# Patient Record
Sex: Female | Born: 1990 | Hispanic: Yes | Marital: Single | Smoking: Never smoker
Health system: Southern US, Community
[De-identification: ages and names within clinical notes are randomized; demographics above are authoritative.]

## PROBLEM LIST (undated history)

## (undated) DIAGNOSIS — M62830 Muscle spasm of back: Secondary | ICD-10-CM

## (undated) DIAGNOSIS — F329 Major depressive disorder, single episode, unspecified: Secondary | ICD-10-CM

## (undated) DIAGNOSIS — F32A Depression, unspecified: Secondary | ICD-10-CM

---
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
## 2017-09-15 ENCOUNTER — Emergency Department (HOSPITAL_COMMUNITY): Payer: Self-pay

## 2017-09-15 ENCOUNTER — Encounter (HOSPITAL_COMMUNITY): Payer: Self-pay

## 2017-09-15 ENCOUNTER — Emergency Department (HOSPITAL_COMMUNITY)
Admission: EM | Admit: 2017-09-15 | Discharge: 2017-09-15 | Disposition: A | Discharge status: Home / Self Care | Payer: Self-pay | Attending: Emergency Medicine | Admitting: Emergency Medicine

## 2017-09-15 ENCOUNTER — Other Ambulatory Visit: Payer: Self-pay

## 2017-09-15 DIAGNOSIS — W260XXA Contact with knife, initial encounter: Secondary | ICD-10-CM | POA: Insufficient documentation

## 2017-09-15 DIAGNOSIS — Y939 Activity, unspecified: Secondary | ICD-10-CM | POA: Insufficient documentation

## 2017-09-15 DIAGNOSIS — Y92000 Kitchen of unspecified non-institutional (private) residence as  the place of occurrence of the external cause: Secondary | ICD-10-CM | POA: Insufficient documentation

## 2017-09-15 DIAGNOSIS — S61012A Laceration without foreign body of left thumb without damage to nail, initial encounter: Secondary | ICD-10-CM | POA: Insufficient documentation

## 2017-09-15 DIAGNOSIS — Y999 Unspecified external cause status: Secondary | ICD-10-CM | POA: Insufficient documentation

## 2017-09-15 DIAGNOSIS — Z23 Encounter for immunization: Secondary | ICD-10-CM | POA: Insufficient documentation

## 2017-09-15 HISTORY — DX: Major depressive disorder, single episode, unspecified: F32.9

## 2017-09-15 HISTORY — DX: Muscle spasm of back: M62.830

## 2017-09-15 HISTORY — DX: Depression, unspecified: F32.A

## 2017-09-15 MED ORDER — BACITRACIN ZINC 500 UNIT/GM EX OINT: 1.0000 "application " | TOPICAL_OINTMENT | Freq: Two times a day (BID) | CUTANEOUS | 0 refills | Status: AC

## 2017-09-15 MED ORDER — TETANUS-DIPHTH-ACELL PERTUSSIS 5-2.5-18.5 LF-MCG/0.5 IM SUSP
0.5000 mL | Freq: Once | INTRAMUSCULAR | Status: AC
  Administered 2017-09-15: 0.5 mL via INTRAMUSCULAR
  Filled 2017-09-15: qty 0.5

## 2017-09-15 MED ORDER — LIDOCAINE-EPINEPHRINE (PF) 2 %-1:200000 IJ SOLN
10.0000 mL | Freq: Once | INTRAMUSCULAR | Status: AC
  Administered 2017-09-15: 10 mL
  Filled 2017-09-15: qty 20

## 2017-09-15 NOTE — ED Provider Notes (Signed)
[facility]-EMERGENCY DEPT Provider Note   CSN: [id number] Arrival date & time: 09/15/17  1016     History   Chief Complaint Chief Complaint  Patient presents with  . thumb laceration    HPI NEKESHA FONT is a 27 y.o. female.  HPI  27 year old female comes in with chief complaint of laceration.  Patient states that she was helping her friend in the kitchen when she cut herself with a chef knife.  Patient had significant bleeding, and decided to come to the ER for further evaluation.  Patient is unsure about her tetanus status.  She denies any numbness, tingling.  Past Medical History:  Diagnosis Date  . Back spasm    chronic  . Depression     There are no active problems to display for this patient.   History reviewed. No pertinent surgical history.   OB History   None      Home Medications    Prior to Admission medications   Medication Sig Start Date End Date Taking? Authorizing Provider  citalopram (CELEXA) 10 MG tablet Take 10 mg by mouth daily. 05/10/17  Yes [provider]  tiZANidine (ZANAFLEX) 4 MG tablet Take 4 mg by mouth 3 (three) times daily. 08/13/17  Yes [provider]  traZODone (DESYREL) 50 MG tablet Take 50 mg by mouth at bedtime. 06/28/17  Yes [provider]    Family History History reviewed. No pertinent family history.  Social History Social History   Tobacco Use  . Smoking status: Never Smoker  . Smokeless tobacco: Never Used  Substance Use Topics  . Alcohol use: Yes    Comment: rarely  . Drug use: Never     Allergies   Patient has no known allergies.   Review of Systems Review of Systems  Constitutional: Positive for activity change.  Skin: Positive for wound.  Allergic/Immunologic: Negative for immunocompromised state.  Neurological: Negative for numbness.  Hematological: Does not bruise/bleed easily.     Physical Exam Updated Vital Signs BP 102/63 (BP Location:  Right Arm)   Pulse 78   Temp 98.3 F (36.8 C) (Oral)   Resp 18   Ht 5' (1.524 m)   Wt 57.6 kg (127 lb)   LMP 09/08/2017   SpO2 100%   BMI 24.80 kg/m   Physical Exam  Constitutional: She appears well-developed.  HENT:  Head: Normocephalic and atraumatic.  Eyes: EOM are normal.  Neck: Normal range of motion. Neck supple.  Cardiovascular: Normal rate.  Pulmonary/Chest: Effort normal.  Neurological: She is alert.  Skin:  On the dorsal aspect of the left thumb there is a 1 cm laceration below the thumb. No active bleeding. Range of motion of the thumb, including flexion, opposition abduction and abduction is normal.  Nursing note and vitals reviewed.    ED Treatments / Results  Labs (all labs ordered are listed, but only abnormal results are displayed) Labs Reviewed - No data to display  EKG None  Radiology Dg Finger Thumb Left  Result Date: 09/15/2017 CLINICAL DATA:  Recent laceration of the thumb with a knife, initial encounter EXAM: LEFT THUMB 2+V COMPARISON:  None. FINDINGS: There is no evidence of fracture or dislocation. There is no evidence of arthropathy or other focal bone abnormality. Soft tissues are unremarkable IMPRESSION: No acute abnormality noted. Electronically Signed   By: Alcide Clever M.D.   On: 09/15/2017 12:23    Procedures Procedures (including critical care time)  Medications Ordered in ED Medications  lidocaine-EPINEPHrine (XYLOCAINE W/EPI) 2 %-1:200000 (PF) injection 10 mL (10 mLs Infiltration Given by Other 09/15/17 1235)  Tdap (BOOSTRIX) injection 0.5 mL (0.5 mLs Intramuscular Given 09/15/17 1234)     Initial Impression / Assessment and Plan / ED Course  I have reviewed the triage vital signs and the nursing notes.  Pertinent labs & imaging results that were available during my care of the patient were reviewed by me and considered in my medical decision making (see chart for details).     27 year old healthy woman comes in after she cut  herself with a knife. The wound appears to be dry based on patient's description.  APP to numb the thumb and repair the laceration, and clean the wound.  Tdap will be given here.  Final Clinical Impressions(s) / ED Diagnoses   Final diagnoses:  Thumb laceration, left, initial encounter    ED Discharge Orders    None       Derwood KaplanNanavati, Creighton Longley, MD 09/15/17 1243

## 2017-09-15 NOTE — ED Triage Notes (Signed)
patient cut her left thumb below the nail bed with a knife while cutting sausage.Bleeding controlled.

## 2017-09-15 NOTE — Discharge Instructions (Addendum)
We saw you in the ER for your laceration. Please read the instructions provided on wound care. Keep the area clean and dry, apply bacitracin ointment daily and take the medications provided. RETURN TO THE ER IF THERE IS INCREASED PAIN, REDNESS, PUS COMING OUT from the wound site.

## 2017-09-15 NOTE — ED Provider Notes (Signed)
Suture repair of L thumb per Dr. Brandy HaleNanavati's request.   LACERATION REPAIR Performed by: Fayrene HelperBowie Oluwatomiwa Chang Authorized by: Fayrene HelperBowie Jamilia Jacques Consent: Verbal consent obtained. Risks and benefits: risks, benefits and alternatives were discussed Consent given by: patient Patient identity confirmed: provided demographic data Prepped and Draped in normal sterile fashion Wound explored  Laceration Location: L thumb, distal phalanx dorsally without nail involvement  Laceration Length: 1cm  No Foreign Bodies seen or palpated  Anesthesia: digital nerve block   Local anesthetic: lidocaine 2% w epinephrine  Anesthetic total: 3 ml  Irrigation method: syringe Amount of cleaning: standard  Skin closure: prolene 5.0  Number of sutures: 3  Technique: simple interrupted  Patient tolerance: Patient tolerated the procedure well with no immediate complications.    Fayrene Helperran, Katilin Raynes, PA-C 09/15/17 1257    Derwood KaplanNanavati, Ankit, MD 09/21/17 [id number]

## 2019-07-27 IMAGING — CR DG FINGER THUMB 2+V*L*
3 series · 3 of 3 positions shown · non-contrast
Comparison: None.

CLINICAL DATA: Recent laceration of the thumb with a knife, initial
encounter

EXAM:
LEFT THUMB 2+V

[x finger pa left]
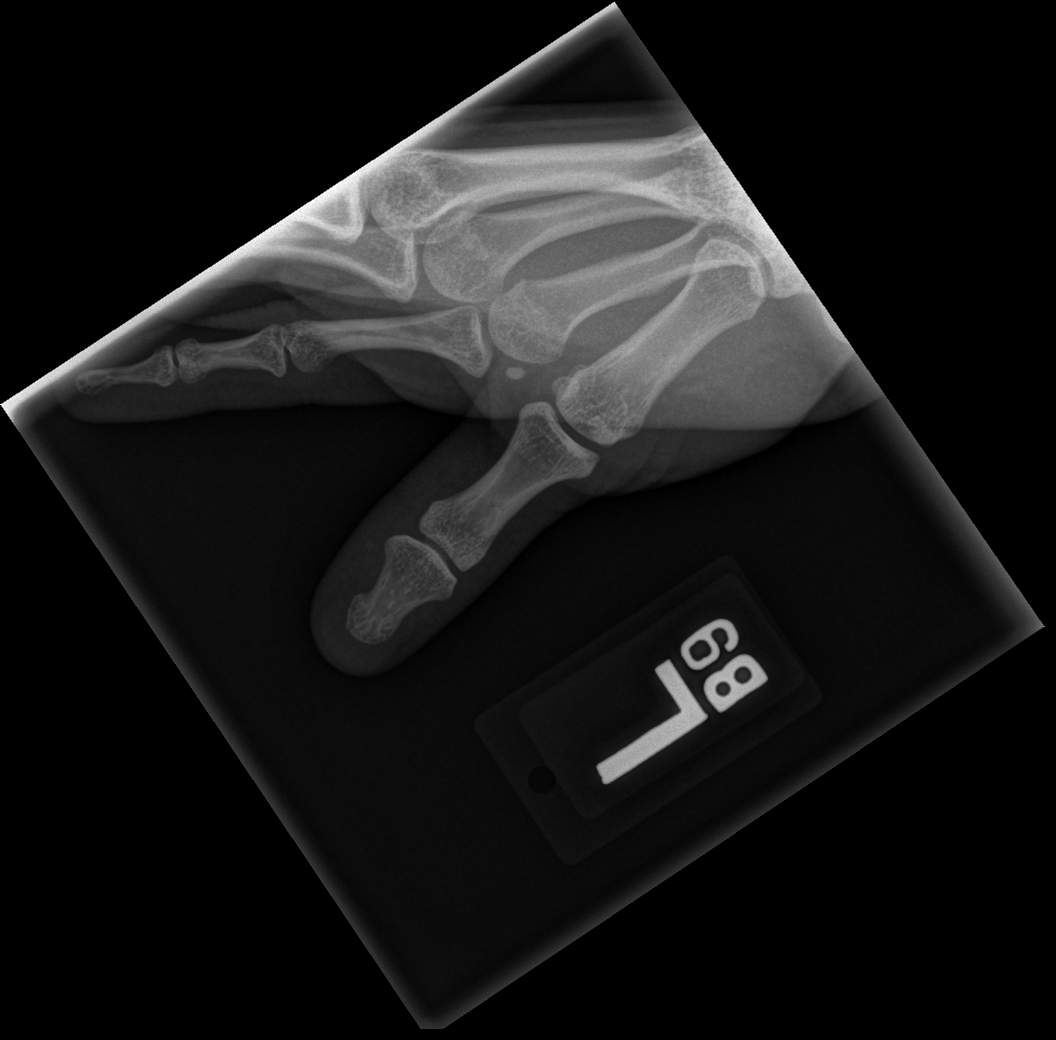

[x finger obl left]
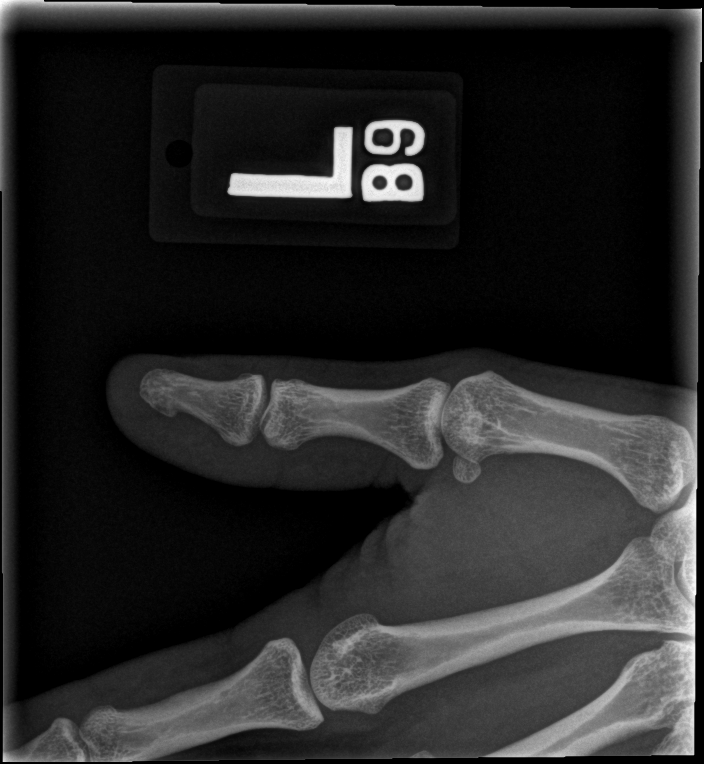

[x finger lat left]
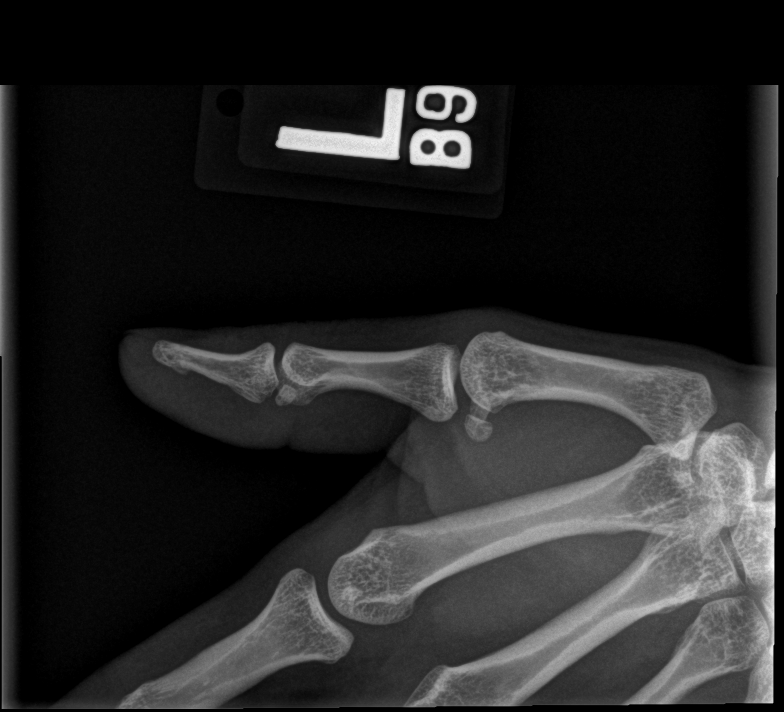

[3 of 3 positions shown; findings below may reference images not displayed]

FINDINGS: There is no evidence of fracture or dislocation. There is no
evidence of arthropathy or other focal bone abnormality. Soft
tissues are unremarkable
IMPRESSION: No acute abnormality noted.
